# Patient Record
Sex: Female | Born: 1975 | Hispanic: No | Marital: Married | Smoking: Never smoker
Health system: Southern US, Community
[De-identification: ages and names within clinical notes are randomized; demographics above are authoritative.]

## PROBLEM LIST (undated history)

## (undated) DIAGNOSIS — G4700 Insomnia, unspecified: Secondary | ICD-10-CM

## (undated) DIAGNOSIS — F419 Anxiety disorder, unspecified: Secondary | ICD-10-CM

## (undated) HISTORY — PX: BREAST ENHANCEMENT SURGERY: SHX7

---

## 1998-08-10 ENCOUNTER — Other Ambulatory Visit: Admission: RE | Admit: 1998-08-10 | Discharge: 1998-08-10 | Payer: Self-pay | Admitting: Obstetrics and Gynecology

## 1999-10-14 ENCOUNTER — Other Ambulatory Visit: Admission: RE | Admit: 1999-10-14 | Discharge: 1999-10-14 | Payer: Self-pay | Admitting: *Deleted

## 1999-10-28 ENCOUNTER — Other Ambulatory Visit: Admission: RE | Admit: 1999-10-28 | Discharge: 1999-10-28 | Payer: Self-pay | Admitting: Obstetrics and Gynecology

## 1999-11-04 ENCOUNTER — Other Ambulatory Visit: Admission: RE | Admit: 1999-11-04 | Discharge: 1999-11-04 | Payer: Self-pay | Admitting: Obstetrics and Gynecology

## 2000-02-20 ENCOUNTER — Other Ambulatory Visit: Admission: RE | Admit: 2000-02-20 | Discharge: 2000-02-20 | Payer: Self-pay | Admitting: Obstetrics and Gynecology

## 2000-05-21 ENCOUNTER — Other Ambulatory Visit: Admission: RE | Admit: 2000-05-21 | Discharge: 2000-05-21 | Payer: Self-pay | Admitting: Obstetrics and Gynecology

## 2000-08-25 ENCOUNTER — Other Ambulatory Visit: Admission: RE | Admit: 2000-08-25 | Discharge: 2000-08-25 | Payer: Self-pay | Admitting: *Deleted

## 2000-11-16 ENCOUNTER — Other Ambulatory Visit: Admission: RE | Admit: 2000-11-16 | Discharge: 2000-11-16 | Payer: Self-pay | Admitting: *Deleted

## 2001-08-11 ENCOUNTER — Other Ambulatory Visit: Admission: RE | Admit: 2001-08-11 | Discharge: 2001-08-11 | Payer: Self-pay | Admitting: Obstetrics and Gynecology

## 2002-08-24 ENCOUNTER — Other Ambulatory Visit: Admission: RE | Admit: 2002-08-24 | Discharge: 2002-08-24 | Payer: Self-pay | Admitting: Obstetrics and Gynecology

## 2003-08-29 ENCOUNTER — Other Ambulatory Visit: Admission: RE | Admit: 2003-08-29 | Discharge: 2003-08-29 | Payer: Self-pay | Admitting: Obstetrics and Gynecology

## 2004-09-04 ENCOUNTER — Other Ambulatory Visit: Admission: RE | Admit: 2004-09-04 | Discharge: 2004-09-04 | Payer: Self-pay | Admitting: Obstetrics and Gynecology

## 2004-09-06 ENCOUNTER — Encounter: Admission: RE | Admit: 2004-09-06 | Discharge: 2004-09-06 | Payer: Self-pay | Admitting: Obstetrics and Gynecology

## 2005-03-17 ENCOUNTER — Ambulatory Visit: Payer: Self-pay | Admitting: Internal Medicine

## 2005-09-23 ENCOUNTER — Other Ambulatory Visit: Admission: RE | Admit: 2005-09-23 | Discharge: 2005-09-23 | Payer: Self-pay | Admitting: Obstetrics and Gynecology

## 2006-04-07 ENCOUNTER — Encounter: Admission: RE | Admit: 2006-04-07 | Discharge: 2006-04-07 | Payer: Self-pay | Admitting: Obstetrics and Gynecology

## 2006-11-27 ENCOUNTER — Other Ambulatory Visit: Admission: RE | Admit: 2006-11-27 | Discharge: 2006-11-27 | Payer: Self-pay | Admitting: Obstetrics and Gynecology

## 2006-12-01 ENCOUNTER — Encounter: Admission: RE | Admit: 2006-12-01 | Discharge: 2006-12-01 | Payer: Self-pay | Admitting: Obstetrics and Gynecology

## 2007-12-02 ENCOUNTER — Encounter: Payer: Self-pay | Admitting: Maternal & Fetal Medicine

## 2007-12-14 ENCOUNTER — Observation Stay: Payer: Self-pay | Admitting: Obstetrics and Gynecology

## 2007-12-21 ENCOUNTER — Observation Stay: Payer: Self-pay | Admitting: Obstetrics and Gynecology

## 2007-12-22 ENCOUNTER — Encounter: Payer: Self-pay | Admitting: Maternal & Fetal Medicine

## 2007-12-23 ENCOUNTER — Encounter: Payer: Self-pay | Admitting: Maternal & Fetal Medicine

## 2008-01-12 ENCOUNTER — Inpatient Hospital Stay: Payer: Self-pay | Admitting: Obstetrics and Gynecology

## 2008-08-14 IMAGING — US ULTRAOUND OB LIMITED - NRPT [facility]
1 series · 14 of 28 positions shown · non-contrast
Comparison: none

[Series 1: ultraound ob limited - nrpt [facility] · 0.35mm/px · 14 of 43 slices shown]
[im 2/43]
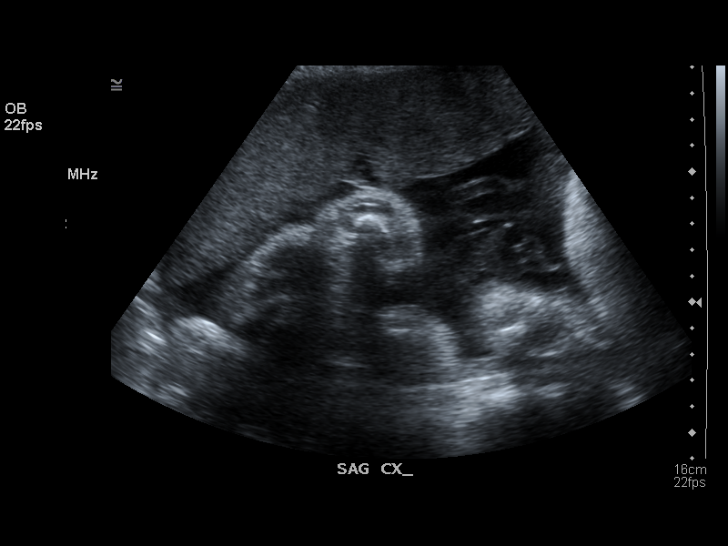
[im 5/43]
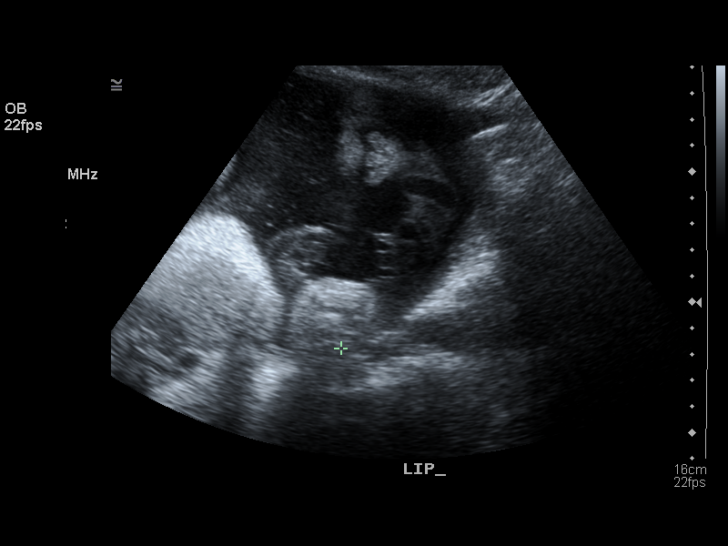
[im 8/43]
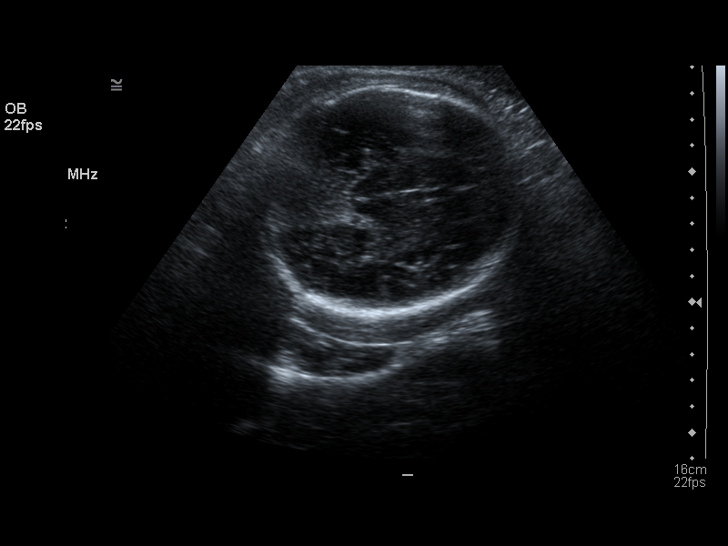
[im 11/43]
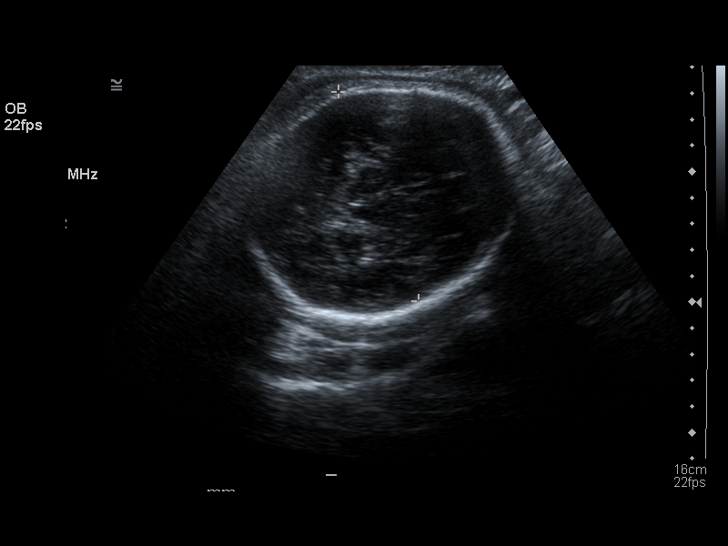
[im 15/43]
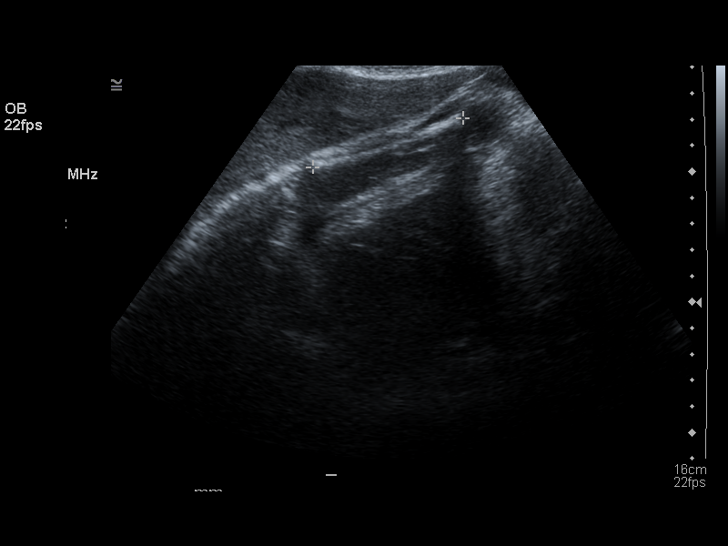
[im 18/43]
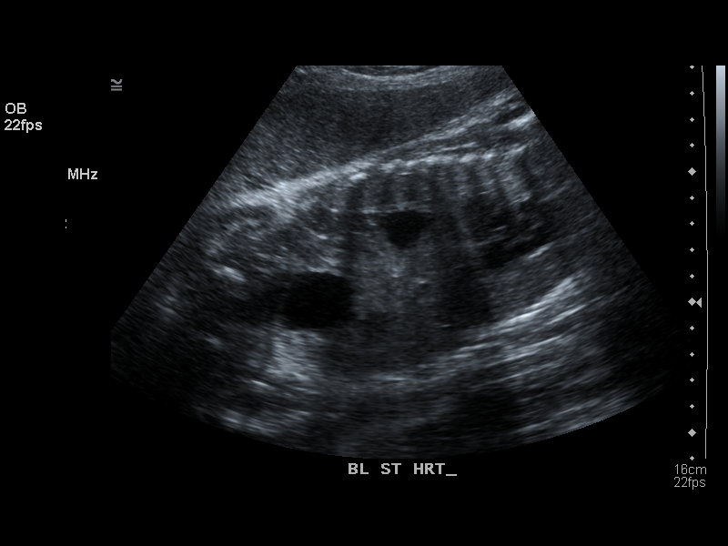
[im 21/43]
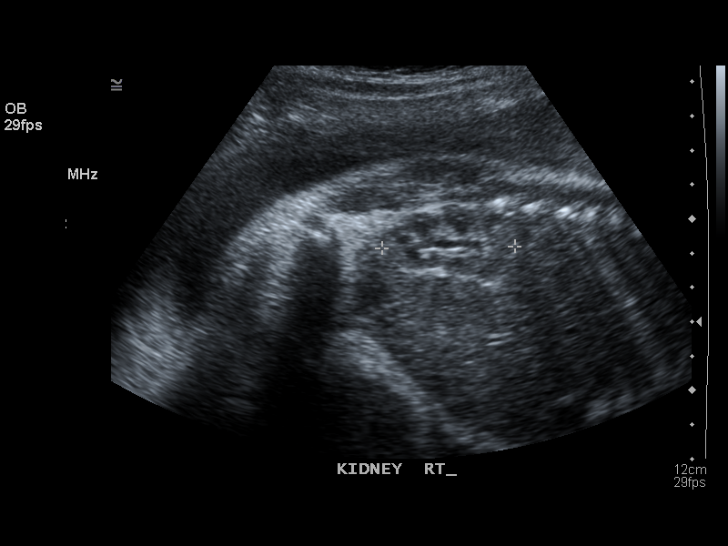
[im 24/43]
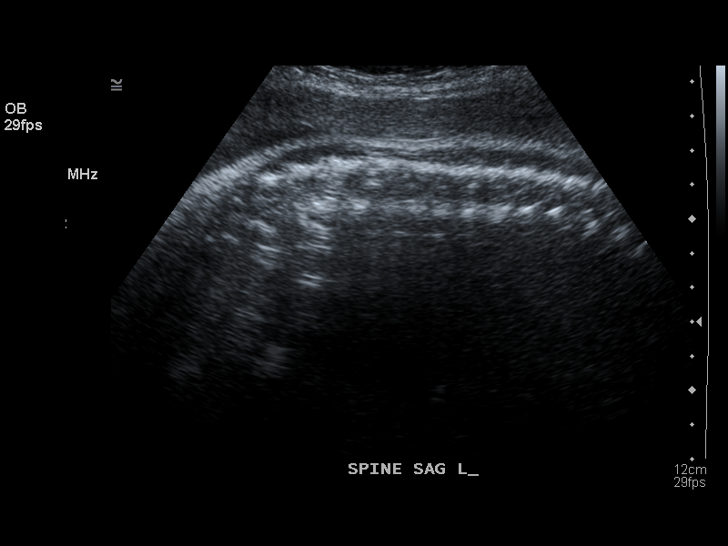
[im 27/43]
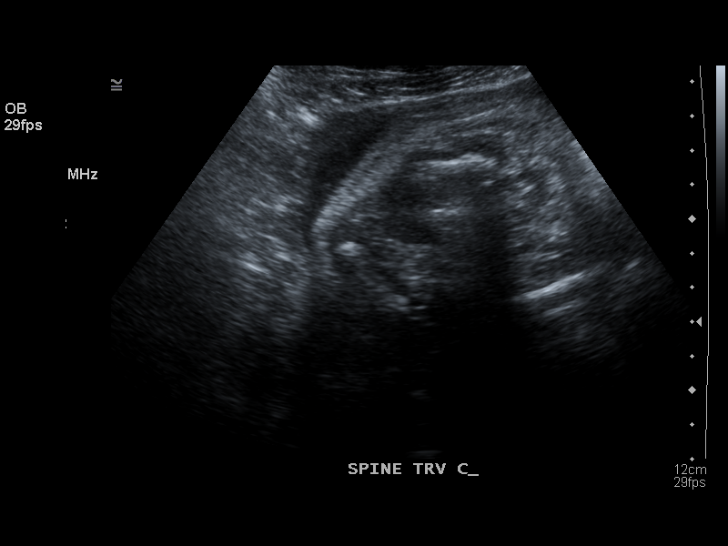
[im 30/43]
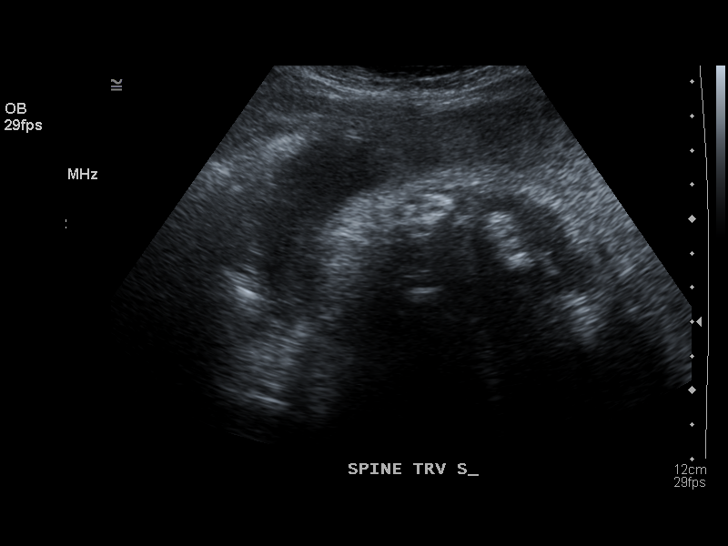
[im 33/43]
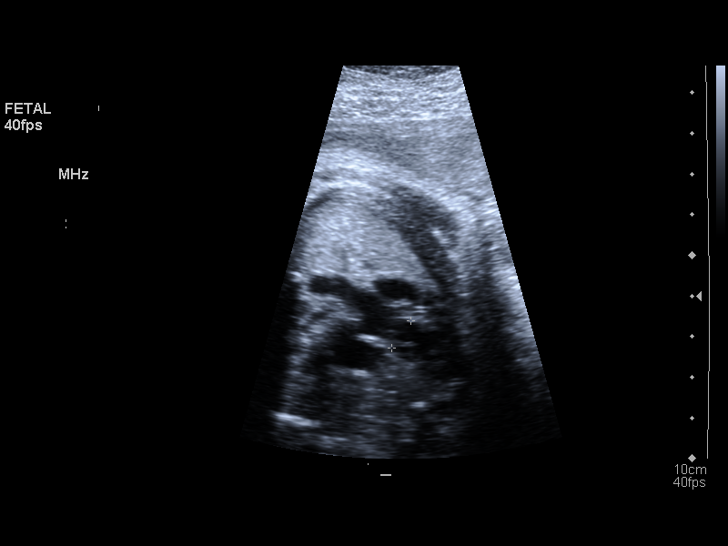
[im 36/43]
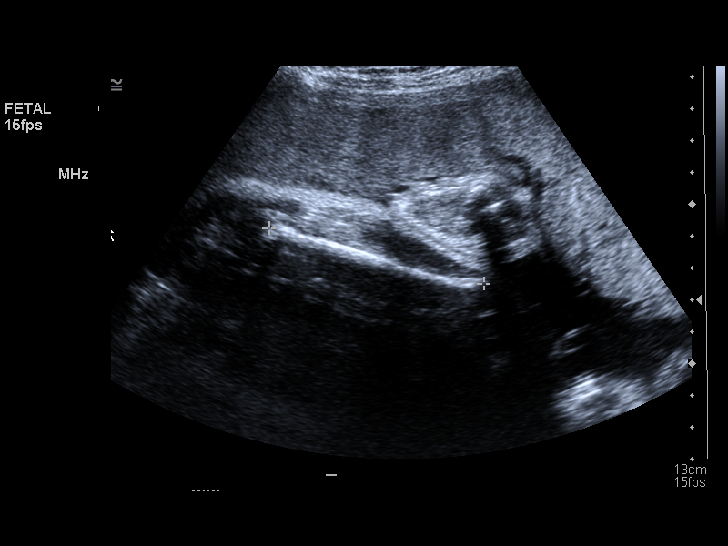
[im 39/43]
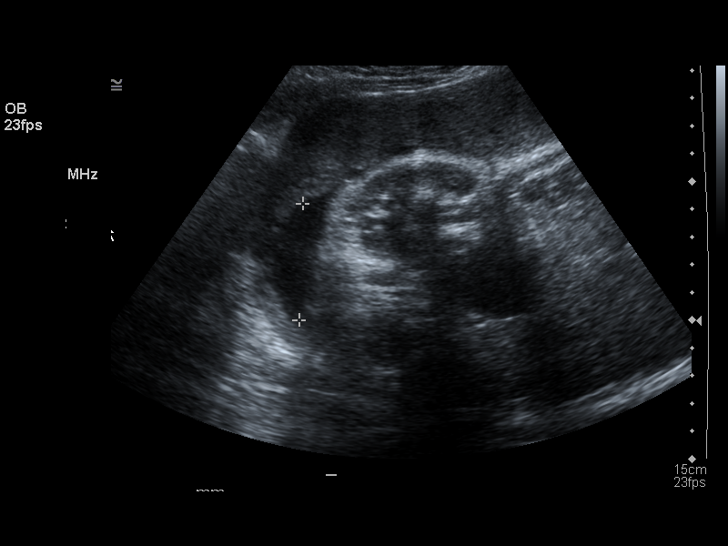
[im 43/43]
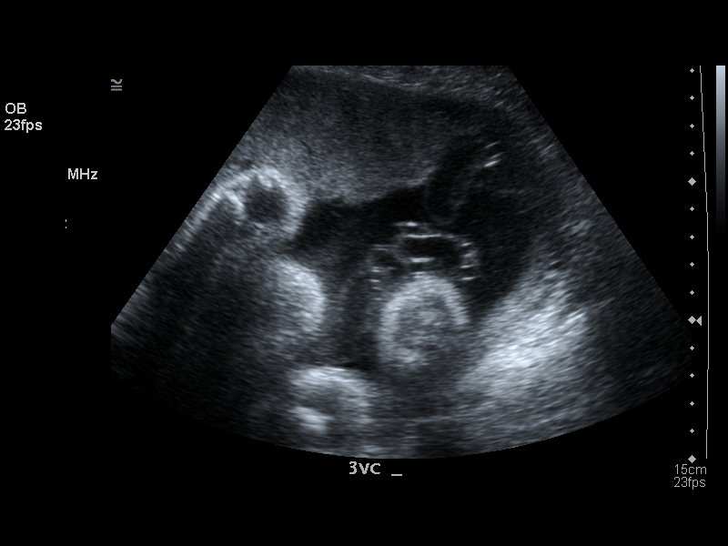

[14 of 28 positions shown; findings below may reference images not displayed]

IMAGES IMPORTED FROM THE [employer] WORKFLOW SYSTEM
NO DICTATION FOR STUDY

## 2008-08-15 ENCOUNTER — Ambulatory Visit: Payer: Self-pay | Admitting: Family Medicine

## 2010-02-06 ENCOUNTER — Emergency Department: Payer: Self-pay | Admitting: Emergency Medicine

## 2010-04-18 ENCOUNTER — Ambulatory Visit: Payer: Self-pay | Admitting: Internal Medicine

## 2011-08-17 ENCOUNTER — Ambulatory Visit: Payer: Self-pay | Admitting: Family Medicine

## 2011-12-12 DIAGNOSIS — G4700 Insomnia, unspecified: Secondary | ICD-10-CM | POA: Insufficient documentation

## 2012-09-21 HISTORY — PX: AUGMENTATION MAMMAPLASTY: SUR837

## 2013-01-10 ENCOUNTER — Ambulatory Visit: Payer: Self-pay

## 2013-03-03 DIAGNOSIS — F419 Anxiety disorder, unspecified: Secondary | ICD-10-CM | POA: Insufficient documentation

## 2013-03-03 DIAGNOSIS — N63 Unspecified lump in unspecified breast: Secondary | ICD-10-CM | POA: Insufficient documentation

## 2013-11-08 ENCOUNTER — Ambulatory Visit: Payer: Self-pay | Admitting: Family Medicine

## 2018-04-19 ENCOUNTER — Encounter: Payer: Self-pay | Admitting: Emergency Medicine

## 2018-04-19 ENCOUNTER — Other Ambulatory Visit: Payer: Self-pay

## 2018-04-19 ENCOUNTER — Ambulatory Visit
Admission: EM | Admit: 2018-04-19 | Discharge: 2018-04-19 | Disposition: A | Discharge status: Home / Self Care | Payer: BLUE CROSS/BLUE SHIELD | Attending: Family Medicine | Admitting: Family Medicine

## 2018-04-19 DIAGNOSIS — S0501XA Injury of conjunctiva and corneal abrasion without foreign body, right eye, initial encounter: Secondary | ICD-10-CM | POA: Diagnosis not present

## 2018-04-19 HISTORY — DX: Insomnia, unspecified: G47.00

## 2018-04-19 HISTORY — DX: Anxiety disorder, unspecified: F41.9

## 2018-04-19 MED ORDER — MOXIFLOXACIN HCL 0.5 % OP SOLN: 1.0000 [drp] | Freq: Three times a day (TID) | OPHTHALMIC | 0 refills | Status: AC

## 2018-04-19 NOTE — ED Triage Notes (Addendum)
Patient in today c/o right eye pain and redness x 3 days. Patient states her eye feels gritty. Patient denies itching. Patient states her eye has been crusty, but not this morning.

## 2018-04-19 NOTE — ED Provider Notes (Signed)
[facility]    CSN: [id number] Arrival date & time: 04/19/18  0807     History   Chief Complaint Chief Complaint  Patient presents with  . Eye Pain    HPI Julie Sanchez is a 42 y.o. female.   42 yo female with a c/o right eye discomfort, redness, tearing for 3 days. States symptoms started one morning after sleeping with her contacts in. States she sleeps with her contacts in frequently and has never had any problem like this.   The history is provided by the patient.    Past Medical History:  Diagnosis Date  . Anxiety   . Insomnia     There are no active problems to display for this patient.   Past Surgical History:  Procedure Laterality Date  . BREAST ENHANCEMENT SURGERY     09/2013    OB History   None      Home Medications    Prior to Admission medications   Medication Sig Start Date End Date Taking? Authorizing Provider  levonorgestrel (MIRENA) 20 MCG/24HR IUD by Intrauterine route.   Yes [provider]  sertraline (ZOLOFT) 50 MG tablet Take 1 tablet by mouth daily. 01/14/17  Yes [provider]  traZODone (DESYREL) 50 MG tablet Take 1 tablet by mouth at bedtime. 11/11/16  Yes [provider]  moxifloxacin (VIGAMOX) 0.5 % ophthalmic solution Place 1 drop into the right eye 3 (three) times daily for 5 days. 04/19/18 04/24/18  Payton Mccallum, MD    Family History Family History  Problem Relation Age of Onset  . Hypertension Mother   . Hyperlipidemia Mother   . Diabetes Mother   . Depression Mother   . Stroke Mother   . Restless legs syndrome Mother   . CAD Mother        CABG x 4  . Hypertension Father     Social History Social History   Tobacco Use  . Smoking status: Never Smoker  . Smokeless tobacco: Never Used  Substance Use Topics  . Alcohol use: Yes    Comment: socially  . Drug use: Never     Allergies   Patient has no known allergies.   Review of Systems Review of Systems   Physical  Exam Triage Vital Signs ED Triage Vitals  Enc Vitals Group     BP 04/19/18 0823 113/78     Pulse Rate 04/19/18 0823 (!) 57     Resp 04/19/18 0823 16     Temp 04/19/18 0823 98.9 F (37.2 C)     Temp Source 04/19/18 0823 Oral     SpO2 04/19/18 0823 100 %     Weight 04/19/18 0824 135 lb (61.2 kg)     Height 04/19/18 0824  (1.702 m)     Head Circumference --      Peak Flow --      Pain Score 04/19/18 0824 5     Pain Loc --      Pain Edu? --      Excl. in GC? --    No data found.  Updated Vital Signs BP 113/78 (BP Location: Left Arm)   Pulse (!) 57   Temp 98.9 F (37.2 C) (Oral)   Resp 16   Ht  (1.702 m)   Wt 135 lb (61.2 kg)   SpO2 100%   BMI 21.14 kg/m   Visual Acuity Right Eye Distance: 20/25(with glasses) Left Eye Distance: 20/20(with glasses) Bilateral Distance: 20/20(with glasses)  Right Eye Near:   Left Eye Near:    Bilateral Near:     Physical Exam  Constitutional: She appears well-developed and well-nourished. No distress.  HENT:  Head: Normocephalic and atraumatic.  Eyes: Pupils are equal, round, and reactive to light. EOM and lids are normal. Lids are everted and swept, no foreign bodies found. Right eye exhibits no exudate. Right conjunctiva is injected. Left conjunctiva is not injected.  Slit lamp exam:      The right eye shows corneal abrasion (2 abrasions noted as per diagram).    Skin: She is not diaphoretic.  Nursing note and vitals reviewed.    UC Treatments / Results  Labs (all labs ordered are listed, but only abnormal results are displayed) Labs Reviewed - No data to display  EKG None Radiology No results found.  Procedures Procedures (including critical care time)  Medications Ordered in UC Medications - No data to display   Initial Impression / Assessment and Plan / UC Course  I have reviewed the triage vital signs and the nursing notes.  Pertinent labs & imaging results that were available during my care of the  patient were reviewed by me and considered in my medical decision making (see chart for details).       Final Clinical Impressions(s) / UC Diagnoses   Final diagnoses:  Abrasion of right cornea, initial encounter    ED Discharge Orders        Ordered    moxifloxacin (VIGAMOX) 0.5 % ophthalmic solution  3 times daily     04/19/18 0850     1.diagnosis and possible complications reviewed with patient 2. rx as per orders above; reviewed possible side effects, interactions, risks and benefits  3. Recommend supportive treatment with cool compresses, otc analgesics prn 4. Follow-up with ophthalmologist this week for recheck  (patient verbalizes understanding)   Controlled Substance Prescriptions [place] Controlled Substance Registry consulted? Not Applicable   Payton Mccallum, MD 04/19/18 0900

## 2018-04-19 NOTE — Discharge Instructions (Signed)
Follow up with eye doctor this week

## 2018-11-11 DIAGNOSIS — R41840 Attention and concentration deficit: Secondary | ICD-10-CM | POA: Insufficient documentation

## 2019-01-20 ENCOUNTER — Other Ambulatory Visit: Payer: Self-pay | Admitting: Family Medicine

## 2019-01-20 DIAGNOSIS — Z1231 Encounter for screening mammogram for malignant neoplasm of breast: Secondary | ICD-10-CM

## 2019-02-07 ENCOUNTER — Ambulatory Visit
Admission: RE | Admit: 2019-02-07 | Discharge: 2019-02-07 | Disposition: A | Discharge status: Home / Self Care | Payer: 59 | Source: Ambulatory Visit | Attending: Family Medicine | Admitting: Family Medicine

## 2019-02-07 ENCOUNTER — Other Ambulatory Visit: Payer: Self-pay | Admitting: Family Medicine

## 2019-02-07 ENCOUNTER — Encounter (INDEPENDENT_AMBULATORY_CARE_PROVIDER_SITE_OTHER): Payer: Self-pay

## 2019-02-07 DIAGNOSIS — Z1231 Encounter for screening mammogram for malignant neoplasm of breast: Secondary | ICD-10-CM | POA: Diagnosis not present

## 2019-02-10 ENCOUNTER — Other Ambulatory Visit: Payer: Self-pay | Admitting: Family Medicine

## 2019-02-10 DIAGNOSIS — N6489 Other specified disorders of breast: Secondary | ICD-10-CM

## 2019-02-10 DIAGNOSIS — R928 Other abnormal and inconclusive findings on diagnostic imaging of breast: Secondary | ICD-10-CM

## 2019-02-22 DIAGNOSIS — F900 Attention-deficit hyperactivity disorder, predominantly inattentive type: Secondary | ICD-10-CM | POA: Insufficient documentation

## 2019-03-01 DIAGNOSIS — T23272A Burn of second degree of left wrist, initial encounter: Secondary | ICD-10-CM | POA: Insufficient documentation

## 2019-10-01 IMAGING — MG DIGITAL SCREENING BILATERAL MAMMOGRAM WITH IMPLANTS, CAD AND TOM
9 of 16 series · 9 of 32 positions shown · non-contrast
Comparison: Left breast ultrasound December 01, 2006 and [place]

CLINICAL DATA: Screening. Patient has known stable benign nodule 3
o'clock position left breast measuring 1.3 cm, consistent with
benign fibroadenoma, per ultrasound and images report dated December 01, 2006

EXAM:
DIGITAL SCREENING BILATERAL MAMMOGRAM WITH IMPLANTS, CAD AND TOMO
The patient has retropectoral implants. Standard and implant
displaced views were performed.

[R MLO]
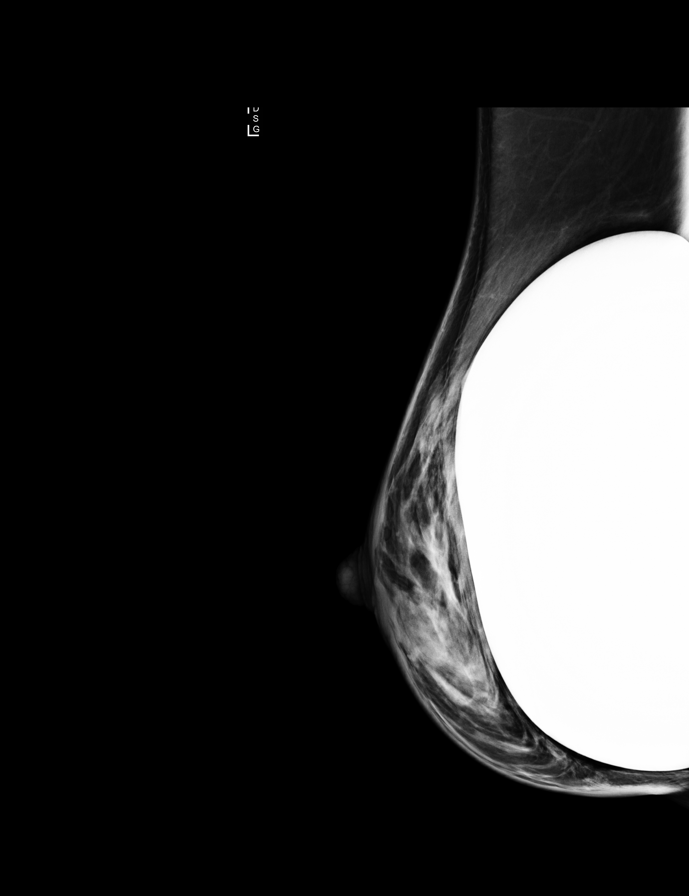

[L MLO]
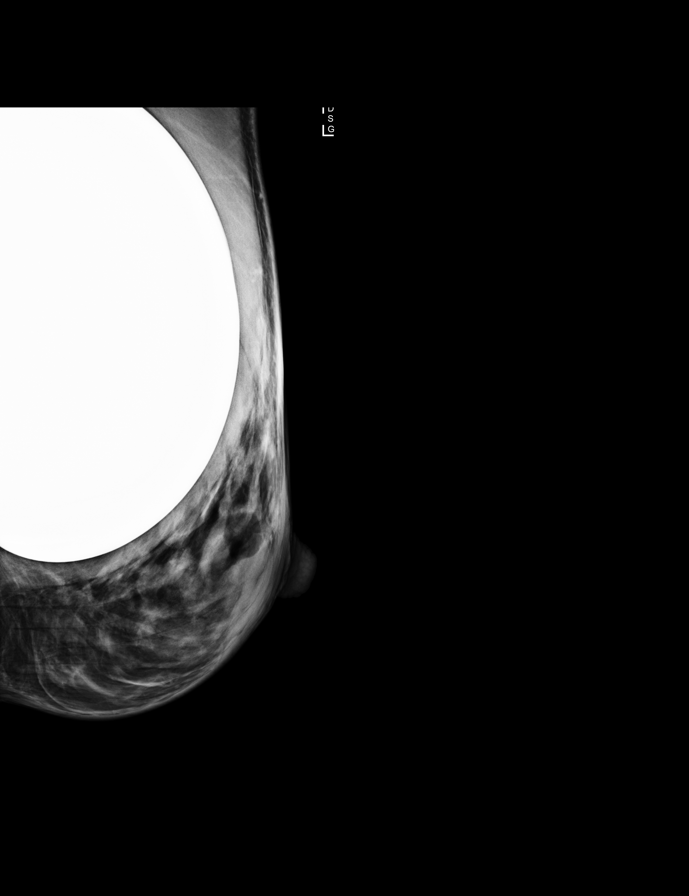

[R CC (1 of 2)]
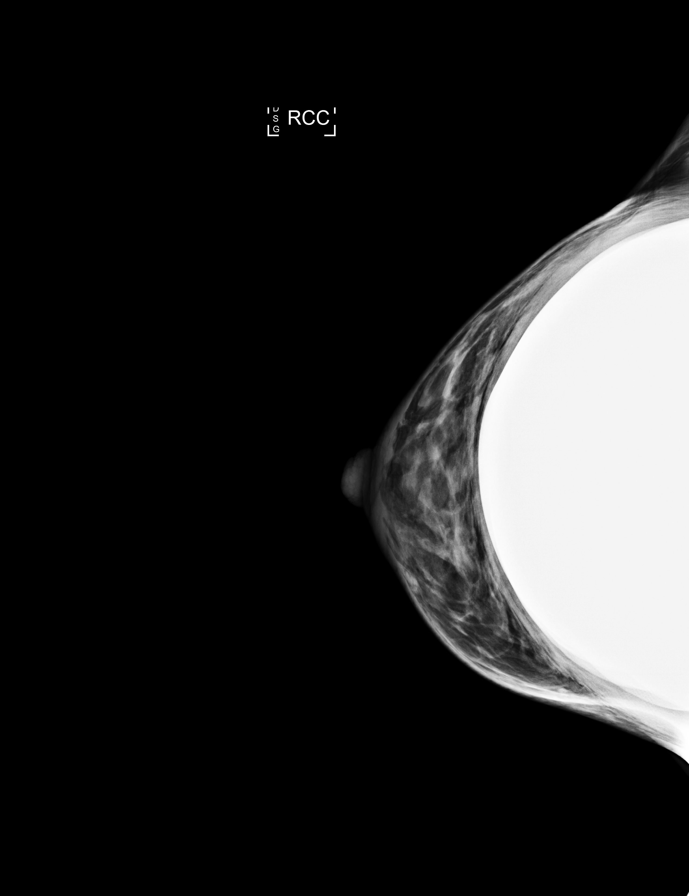

[L CC (1 of 2)]
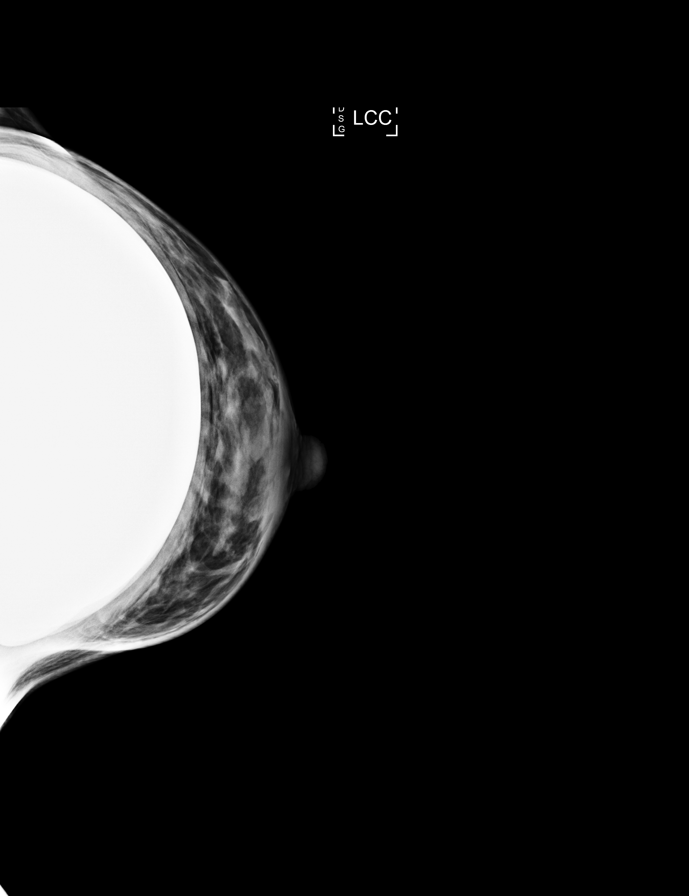

[R CC (2 of 2)]
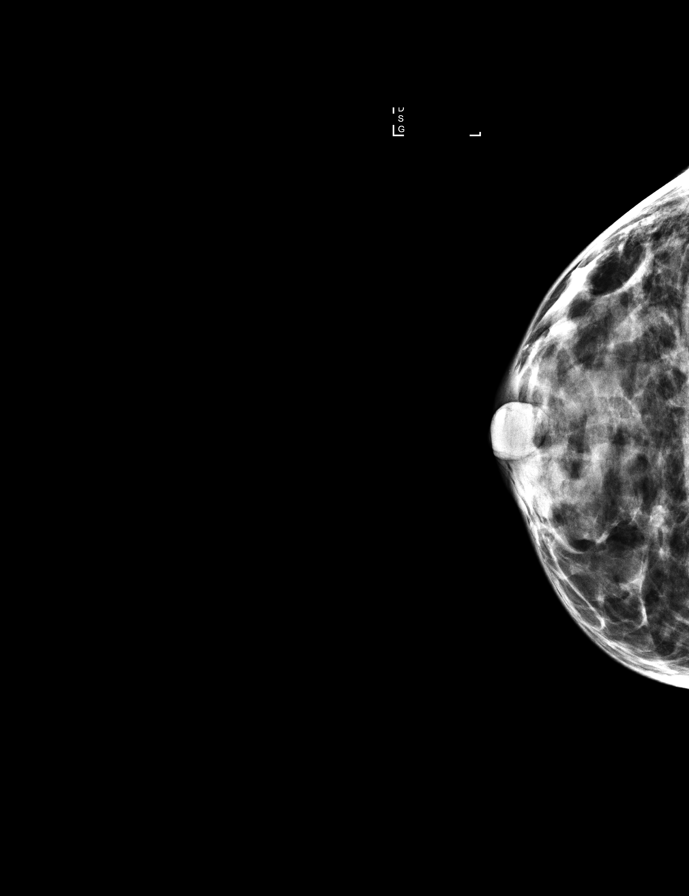

[L MLO synth-2D]
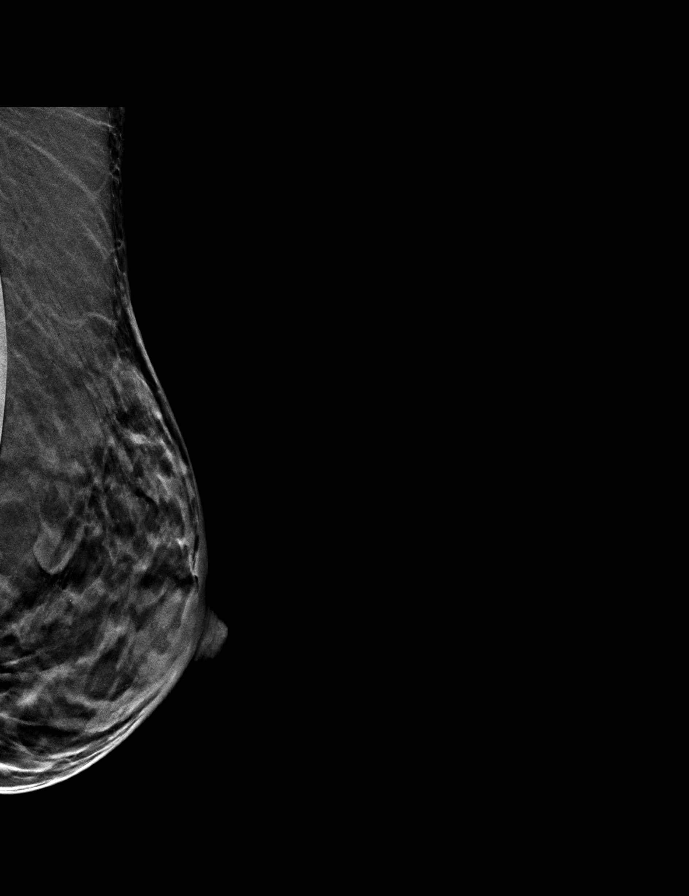

[R CC synth-2D]
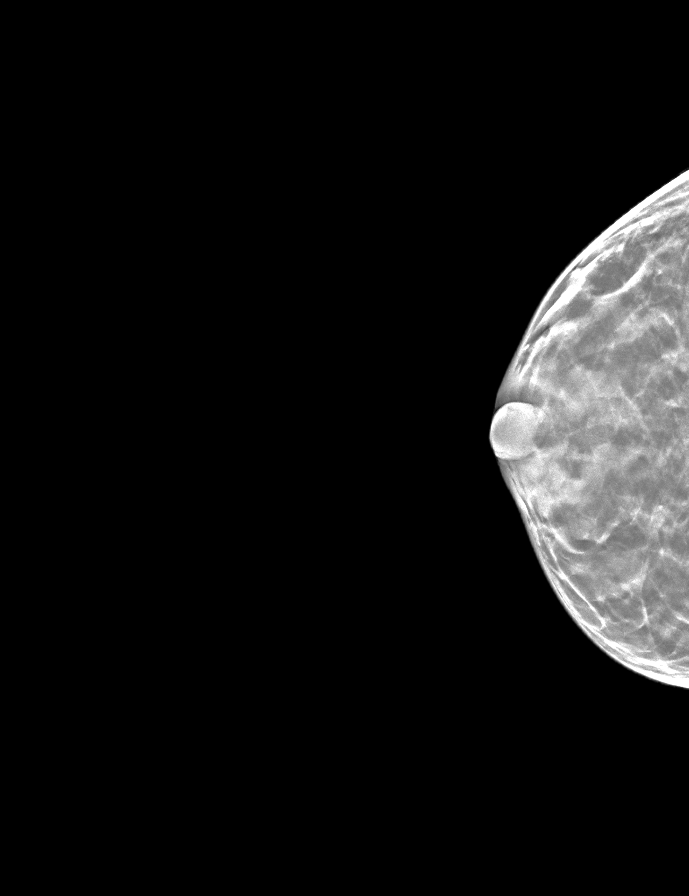

[R MLO synth-2D]
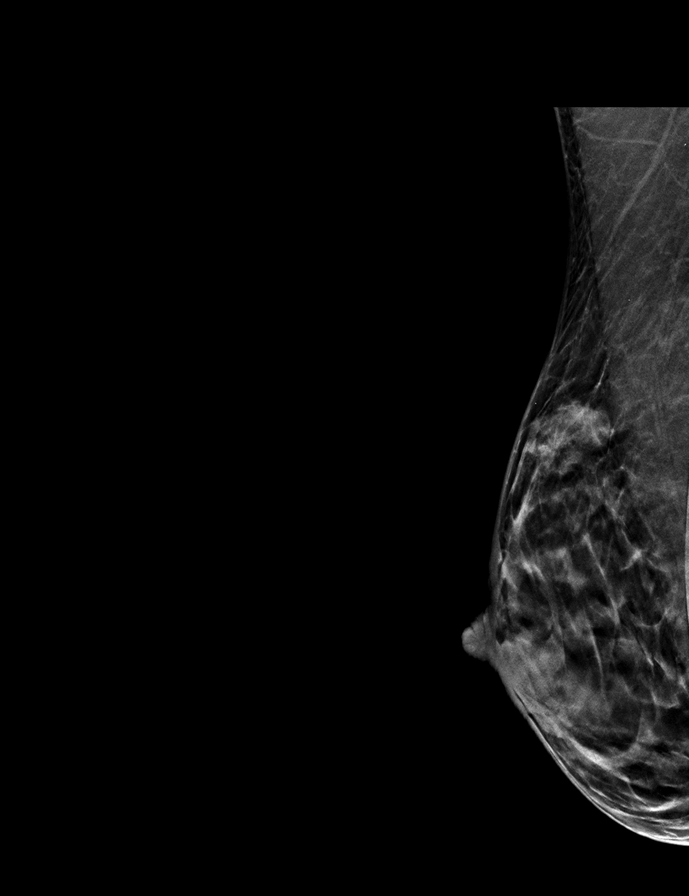

[L CC (2 of 2)]
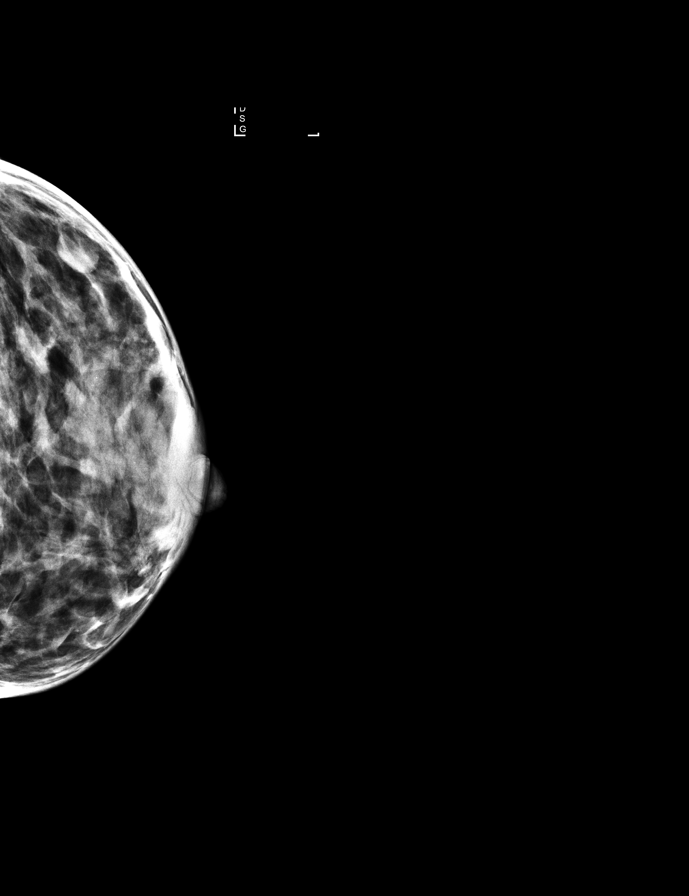

[9 of 32 positions shown; findings below may reference images not displayed]

ACR Breast Density Category c: The breast tissue is heterogeneously
dense, which may obscure small masses.
FINDINGS: In the right breast, a possible asymmetry warrants further
evaluation. In the left breast, no suspicious masses or malignant
type calcifications are identified. Images were processed with CAD.
IMPRESSION: Further evaluation is suggested for possible asymmetry in the right
breast.

RECOMMENDATION:
Diagnostic mammogram and possibly ultrasound of the right breast.
(Code:[id number]-V-55J)

The patient will be contacted regarding the findings, and additional
imaging will be scheduled.

BI-RADS CATEGORY  0: Incomplete. Need additional imaging evaluation
and/or prior mammograms for comparison.

## 2022-08-27 ENCOUNTER — Ambulatory Visit
Admission: RE | Admit: 2022-08-27 | Discharge: 2022-08-27 | Disposition: A | Discharge status: Home / Self Care | Payer: 59 | Source: Ambulatory Visit | Attending: Physician Assistant | Admitting: Physician Assistant

## 2022-08-27 ENCOUNTER — Other Ambulatory Visit: Payer: Self-pay

## 2022-08-27 VITALS — BP 111/82 | HR 75 | Temp 98.8°F | Resp 16 | Ht 67.0 in | Wt 134.9 lb

## 2022-08-27 DIAGNOSIS — R350 Frequency of micturition: Secondary | ICD-10-CM | POA: Diagnosis not present

## 2022-08-27 DIAGNOSIS — R3915 Urgency of urination: Secondary | ICD-10-CM

## 2022-08-27 LAB — URINALYSIS, ROUTINE W REFLEX MICROSCOPIC
Bilirubin Urine: NEGATIVE
Glucose, UA: NEGATIVE mg/dL
Hgb urine dipstick: NEGATIVE
Ketones, ur: NEGATIVE mg/dL
Leukocytes,Ua: NEGATIVE
Nitrite: NEGATIVE
Protein, ur: NEGATIVE mg/dL
Specific Gravity, Urine: 1.01 (ref 1.005–1.030)
pH: 6.5 (ref 5.0–8.0)

## 2022-08-27 NOTE — ED Triage Notes (Signed)
Pt c/o urinary frequency, pressure in her bladder. Started about a week and half ago. Denies lower back pain or fever.

## 2022-08-27 NOTE — ED Provider Notes (Signed)
[facility]    CSN: [id number] Arrival date & time: 08/27/22  1148      History   Chief Complaint Chief Complaint  Patient presents with   Urinary Frequency   Appointment    HPI KESHANNA RISO is a 46 y.o. female presenting for 7 to 10-day history of urinary frequency, urgency, bladder pressure and fullness.  Denies dysuria, fever, chills.  Has had some lower abdominal discomfort and lower back pain at times but denies anything severe.  No vaginal itching or discharge.  No concern for STIs.  Patient is afraid she may have a UTI.  No history of UTIs.  She does report that she holds her urine a lot at work and she also hydrates significantly.  HPI  Past Medical History:  Diagnosis Date   Anxiety    Insomnia     There are no problems to display for this patient.   Past Surgical History:  Procedure Laterality Date   AUGMENTATION MAMMAPLASTY  09/21/2012   BREAST ENHANCEMENT SURGERY     09/2013    OB History   No obstetric history on file.      Home Medications    Prior to Admission medications   Medication Sig Start Date End Date Taking? Authorizing Provider  levonorgestrel (MIRENA) 20 MCG/24HR IUD by Intrauterine route.   Yes [provider]  sertraline (ZOLOFT) 50 MG tablet Take 1 tablet by mouth daily. 01/14/17  Yes [provider]  traZODone (DESYREL) 50 MG tablet Take 1 tablet by mouth at bedtime. 11/11/16  Yes [provider]    Family History Family History  Problem Relation Age of Onset   Hypertension Mother    Hyperlipidemia Mother    Diabetes Mother    Depression Mother    Stroke Mother    Restless legs syndrome Mother    CAD Mother        CABG x 4   Hypertension Father    Breast cancer Maternal Aunt     Social History Social History   Tobacco Use   Smoking status: Never   Smokeless tobacco: Never  Vaping Use   Vaping Use: Never used  Substance Use Topics   Alcohol use: Yes    Comment: socially    Drug use: Never     Allergies   Patient has no known allergies.   Review of Systems Review of Systems  Constitutional:  Negative for chills and fever.  Gastrointestinal:  Positive for abdominal pain. Negative for diarrhea, nausea and vomiting.  Genitourinary:  Positive for frequency and urgency. Negative for decreased urine volume, dysuria, flank pain, hematuria, pelvic pain, vaginal bleeding, vaginal discharge and vaginal pain.  Musculoskeletal:  Positive for back pain.  Skin:  Negative for rash.     Physical Exam Triage Vital Signs ED Triage Vitals  Enc Vitals Group     BP      Pulse      Resp      Temp      Temp src      SpO2      Weight      Height      Head Circumference      Peak Flow      Pain Score      Pain Loc      Pain Edu?      Excl. in GC?    No data found.  Updated Vital Signs BP 111/82 (BP Location: Left Arm)   Pulse 75  Temp 98.8 F (37.1 C) (Oral)   Resp 16   Ht 5\' 7"  (1.702 m)   Wt 134 lb 14.7 oz (61.2 kg)   SpO2 100%   BMI 21.13 kg/m   Physical Exam Vitals and nursing note reviewed.  Constitutional:      General: She is not in acute distress.    Appearance: Normal appearance. She is not ill-appearing or toxic-appearing.  HENT:     Head: Normocephalic and atraumatic.  Eyes:     General: No scleral icterus.       Right eye: No discharge.        Left eye: No discharge.     Conjunctiva/sclera: Conjunctivae normal.  Cardiovascular:     Rate and Rhythm: Normal rate and regular rhythm.     Heart sounds: Normal heart sounds.  Pulmonary:     Effort: Pulmonary effort is normal. No respiratory distress.     Breath sounds: Normal breath sounds.  Abdominal:     Palpations: Abdomen is soft.     Tenderness: There is no abdominal tenderness. There is no right CVA tenderness or left CVA tenderness.  Musculoskeletal:     Cervical back: Neck supple.  Skin:    General: Skin is dry.  Neurological:     General: No focal deficit present.      Mental Status: She is alert. Mental status is at baseline.     Motor: No weakness.     Gait: Gait normal.  Psychiatric:        Mood and Affect: Mood normal.        Behavior: Behavior normal.        Thought Content: Thought content normal.      UC Treatments / Results  Labs (all labs ordered are listed, but only abnormal results are displayed) Labs Reviewed  URINE CULTURE  URINALYSIS, ROUTINE W REFLEX MICROSCOPIC    EKG   Radiology No results found.  Procedures Procedures (including critical care time)  Medications Ordered in UC Medications - No data to display  Initial Impression / Assessment and Plan / UC Course  I have reviewed the triage vital signs and the nursing notes.  Pertinent labs & imaging results that were available during my care of the patient were reviewed by me and considered in my medical decision making (see chart for details).   46 year old female presenting for urinary frequency/urgency and suprapubic pressure as well as bladder fullness for the past 7 to 10 days.  No associated pain, vaginal discharge or concern for STIs.  Vitals are normal and stable and she is overall well-appearing.  On exam abdomen is soft and nontender.  No CVA tenderness.  UA obtained today shows no abnormality.  Patient is still significant concerned about a UTI so I have sent the urine for culture and advised we will start her on antibiotics if the culture is positive.  Discussed following up with urology if urine culture is negative and she continues to experience these symptoms that she may need to have a bladder scan to make sure her bladder is emptying correctly.  May also need cystoscopy to assess for possible interstitial cystitis or other conditions.   Final Clinical Impressions(s) / UC Diagnoses   Final diagnoses:  Urinary frequency  Urinary urgency     Discharge Instructions      -The urinalysis is normal.  It does not look like you have a UTI but we will  send the urine for culture to see if any  bacteria grows and will give you a call if you need to start antibiotics.  If you do not hear from [place] and you continue to have the symptoms, please follow-up with your primary care provider as you may need a referral to urology.     ED Prescriptions   None    PDMP not reviewed this encounter.   Shirlee Latch, PA-C 08/27/22 1245

## 2022-08-27 NOTE — Discharge Instructions (Addendum)
-  The urinalysis is normal.  It does not look like you have a UTI but we will send the urine for culture to see if any bacteria grows and will give you a call if you need to start antibiotics.  If you do not hear from [place] and you continue to have the symptoms, please follow-up with your primary care provider as you may need a referral to urology.

## 2022-08-29 LAB — URINE CULTURE: Culture: NO GROWTH

## 2024-02-26 ENCOUNTER — Ambulatory Visit
Admission: RE | Admit: 2024-02-26 | Discharge: 2024-02-26 | Disposition: A | Discharge status: Home / Self Care | Source: Ambulatory Visit

## 2024-02-26 ENCOUNTER — Ambulatory Visit (INDEPENDENT_AMBULATORY_CARE_PROVIDER_SITE_OTHER)

## 2024-02-26 VITALS — BP 131/87 | HR 77 | Temp 98.4°F | Resp 16

## 2024-02-26 DIAGNOSIS — T8332XA Displacement of intrauterine contraceptive device, initial encounter: Secondary | ICD-10-CM | POA: Insufficient documentation

## 2024-02-26 DIAGNOSIS — G9331 Postviral fatigue syndrome: Secondary | ICD-10-CM

## 2024-02-26 DIAGNOSIS — R052 Subacute cough: Secondary | ICD-10-CM

## 2024-02-26 MED ORDER — PREDNISONE 10 MG (21) PO TBPK: ORAL_TABLET | ORAL | 0 refills | Status: AC

## 2024-02-26 MED ORDER — PROMETHAZINE-DM 6.25-15 MG/5ML PO SYRP: 5.0000 mL | ORAL_SOLUTION | Freq: Four times a day (QID) | ORAL | 0 refills | Status: AC | PRN

## 2024-02-26 MED ORDER — BENZONATATE 100 MG PO CAPS: 200.0000 mg | ORAL_CAPSULE | Freq: Three times a day (TID) | ORAL | 0 refills | Status: AC

## 2024-02-26 NOTE — ED Triage Notes (Signed)
 Patient presents to UC for cough x 3 weeks. Dx with flu A 02/16. Prescribed Tamiful and bromfed med with some relief. States she continued to have the cough and congestion. Sore throat from coughing spells. SOB when working out.   Denies fever.
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
## 2024-02-26 NOTE — Discharge Instructions (Addendum)
 Your chest x-ray did not show any evidence of pneumonia.  I suspect that your cough is a result of residual inflammation from your recent bout of influenza.  Starting tomorrow morning at breakfast time, and going forward each morning at breakfast over the next few days, take the prednisone by mouth.  This will help decrease your pulmonary inflammation and should help your breathing and cough.  Use the Tessalon Perles every 8 hours during the day as needed for cough.  Take them with a small sip of water.  They may give you numbness to the base of your tongue, or a metallic taste in your mouth, this is normal.  Use the Promethazine DM cough syrup at bedtime as needed for cough and congestion.  If your symptoms are not improving, or they worsen, either return for reevaluation or follow-up with your primary care provider.

## 2024-03-31 ENCOUNTER — Ambulatory Visit: Payer: Self-pay

## 2024-03-31 DIAGNOSIS — K641 Second degree hemorrhoids: Secondary | ICD-10-CM | POA: Diagnosis not present

## 2024-03-31 DIAGNOSIS — Z1211 Encounter for screening for malignant neoplasm of colon: Secondary | ICD-10-CM | POA: Diagnosis present
